# Patient Record
Sex: Female | Born: 1970 | Race: White | Hispanic: No | Marital: Single
Health system: Southern US, Community
[De-identification: ages and names within clinical notes are randomized; demographics above are authoritative.]

---

## 2003-03-13 ENCOUNTER — Emergency Department (HOSPITAL_COMMUNITY): Admission: EM | Admit: 2003-03-13 | Discharge: 2003-03-13 | Payer: Self-pay | Admitting: Emergency Medicine

## 2007-03-03 ENCOUNTER — Emergency Department (HOSPITAL_COMMUNITY): Admission: EM | Admit: 2007-03-03 | Discharge: 2007-03-03 | Payer: Self-pay | Admitting: Emergency Medicine

## 2011-02-25 LAB — URINALYSIS, ROUTINE W REFLEX MICROSCOPIC
Bilirubin Urine: NEGATIVE
Nitrite: NEGATIVE
Protein, ur: NEGATIVE
Specific Gravity, Urine: 1.025
Urobilinogen, UA: 0.2
pH: 5.5

## 2011-02-25 LAB — CBC
HCT: 44.3
RBC: 5.06
RDW: 13.4

## 2011-02-25 LAB — BASIC METABOLIC PANEL
CO2: 21
Calcium: 9.3
Chloride: 106
GFR calc Af Amer: >60
GFR calc non Af Amer: >60
Glucose, Bld: 125 — ABNORMAL HIGH
Sodium: 137

## 2011-02-25 LAB — PREGNANCY, URINE: Preg Test, Ur: NEGATIVE

## 2011-02-25 LAB — URINE MICROSCOPIC-ADD ON

## 2011-02-25 LAB — DIFFERENTIAL
Basophils Absolute: 0.8 — ABNORMAL HIGH
Eosinophils Relative: 2
Lymphs Abs: 3.4 — ABNORMAL HIGH
Monocytes Absolute: 1.4 — ABNORMAL HIGH

## 2016-01-06 ENCOUNTER — Emergency Department (HOSPITAL_COMMUNITY): Payer: Self-pay

## 2016-01-06 ENCOUNTER — Emergency Department (HOSPITAL_COMMUNITY)
Admission: EM | Admit: 2016-01-06 | Discharge: 2016-01-06 | Disposition: A | Discharge status: Home / Self Care | Payer: Self-pay | Attending: Emergency Medicine | Admitting: Emergency Medicine

## 2016-01-06 ENCOUNTER — Encounter (HOSPITAL_COMMUNITY): Payer: Self-pay | Admitting: Radiology

## 2016-01-06 DIAGNOSIS — I10 Essential (primary) hypertension: Secondary | ICD-10-CM | POA: Insufficient documentation

## 2016-01-06 DIAGNOSIS — R079 Chest pain, unspecified: Secondary | ICD-10-CM

## 2016-01-06 DIAGNOSIS — R0789 Other chest pain: Secondary | ICD-10-CM | POA: Insufficient documentation

## 2016-01-06 LAB — BASIC METABOLIC PANEL
Anion gap: 8 (ref 5–15)
BUN: 9 mg/dL (ref 6–20)
CHLORIDE: 105 mmol/L (ref 101–111)
CO2: 23 mmol/L (ref 22–32)
CREATININE: 0.83 mg/dL (ref 0.44–1.00)
Calcium: 9.6 mg/dL (ref 8.9–10.3)
GFR calc non Af Amer: >60 mL/min (ref >60)
GLUCOSE: 119 mg/dL — AB (ref 65–99)
Potassium: 3.8 mmol/L (ref 3.5–5.1)
Sodium: 136 mmol/L (ref 135–145)

## 2016-01-06 LAB — CBC WITH DIFFERENTIAL/PLATELET
Basophils Absolute: 0.1 10*3/uL (ref 0.0–0.1)
Basophils Relative: 0 %
EOS ABS: 0.2 10*3/uL (ref 0.0–0.7)
Eosinophils Relative: 1 %
HCT: 42.7 % (ref 36.0–46.0)
HEMOGLOBIN: 13.8 g/dL (ref 12.0–15.0)
LYMPHS ABS: 2.4 10*3/uL (ref 0.7–4.0)
Lymphocytes Relative: 16 %
MCH: 28.8 pg (ref 26.0–34.0)
MCHC: 32.3 g/dL (ref 30.0–36.0)
MCV: 89.1 fL (ref 78.0–100.0)
MONO ABS: 0.8 10*3/uL (ref 0.1–1.0)
MONOS PCT: 5 %
NEUTROS PCT: 78 %
Neutro Abs: 11.6 10*3/uL — ABNORMAL HIGH (ref 1.7–7.7)
Platelets: 306 10*3/uL (ref 150–400)
RBC: 4.79 MIL/uL (ref 3.87–5.11)
RDW: 14.1 % (ref 11.5–15.5)
WBC: 15 10*3/uL — ABNORMAL HIGH (ref 4.0–10.5)

## 2016-01-06 LAB — TROPONIN I

## 2016-01-06 LAB — I-STAT TROPONIN, ED: TROPONIN I, POC: 0.01 ng/mL (ref 0.00–0.08)

## 2016-01-06 LAB — I-STAT BETA HCG BLOOD, ED (MC, WL, AP ONLY): I-stat hCG, quantitative: <5 m[IU]/mL (ref <5)

## 2016-01-06 MED ORDER — SODIUM CHLORIDE 0.9 % IV BOLUS (SEPSIS)
1000.0000 mL | Freq: Once | INTRAVENOUS | Status: AC
  Administered 2016-01-06: 1000 mL via INTRAVENOUS

## 2016-01-06 MED ORDER — IOPAMIDOL (ISOVUE-300) INJECTION 61%: 75.0000 mL | Freq: Once | INTRAVENOUS | Status: DC | PRN

## 2016-01-06 NOTE — ED Notes (Signed)
Pt. Ambulatory to bathroom with no complaints of dizziness/pain.

## 2016-01-06 NOTE — Discharge Instructions (Signed)

## 2016-01-06 NOTE — ED Provider Notes (Signed)
Emergency Department Provider Note   I have reviewed the triage vital signs and the nursing notes.   HISTORY  Chief Complaint Chest Pain   HPI Abigail Stanton is a 45 y.o. female with PMH of HTN and tobacco/marijuana use presents to the emergency department for evaluation of chest pressure. The patient states that she did not sleep much last night and was planning on going 2 and a clips party today. At the party she was smoking a large amount of "high quality" marijuana which is unusual for her. She reports feeling "pretty high" and then shortly after developed some central chest pressure. No associated difficulty breathing or nausea. No other people smoking the marijuana had similar symptoms. She reports "seeing god and a dove and then I was asking people around me to call 911." She denies using other drugs. Denies alcohol use. She denies any headache, fever, neck discomfort. She reports that her chest discomfort has resolved at this time.  History reviewed. No pertinent past medical history.  There are no active problems to display for this patient.   No past surgical history on file.  Current Outpatient Rx  . Order #: [id number] Class: Historical Med    Allergies Review of patient's allergies indicates no known allergies.  No family history on file.  Social History Social History  Substance Use Topics  . Smoking status: Not on file  . Smokeless tobacco: Not on file  . Alcohol use Not on file    Review of Systems  Constitutional: No fever/chills Eyes: No visual changes. ENT: No sore throat. Cardiovascular: Positive chest pain. Respiratory: Denies shortness of breath. Gastrointestinal: No abdominal pain.  No nausea, no vomiting.  No diarrhea.  No constipation. Genitourinary: Negative for dysuria. Musculoskeletal: Negative for back pain. Skin: Negative for rash. Neurological: Negative for headaches, focal weakness or numbness.  10-point ROS otherwise  negative.  ____________________________________________   PHYSICAL EXAM:  VITAL SIGNS: ED Triage Vitals [01/06/16 1537]  Enc Vitals Group     BP 186/98     Pulse Rate (!) 126     Resp 20     Temp 99.1 F (37.3 C)     Temp Source Oral     SpO2 98 %   Constitutional: Alert and oriented. Well appearing and in no acute distress. Eyes: Conjunctivae are normal. PERRL. EOMI. Head: Atraumatic. Nose: No congestion/rhinnorhea. Mouth/Throat: Mucous membranes are moist.  Oropharynx non-erythematous. Neck: No stridor.  No meningeal signs.   Cardiovascular: Tachycardia. Good peripheral circulation. Grossly normal heart sounds.   Respiratory: Normal respiratory effort.  No retractions. Lungs CTAB. Gastrointestinal: Soft and nontender. No distention.  Musculoskeletal: No lower extremity tenderness nor edema. No gross deformities of extremities. Neurologic:  Normal speech and language. No gross focal neurologic deficits are appreciated.  Skin:  Skin is warm, dry and intact. No rash noted. Psychiatric: Mood and affect are normal. Speech and behavior are normal.  ____________________________________________   LABS (all labs ordered are listed, but only abnormal results are displayed)  Labs Reviewed  BASIC METABOLIC PANEL - Abnormal; Notable for the following:       Result Value   Glucose, Bld 119 (*)    All other components within normal limits  CBC WITH DIFFERENTIAL/PLATELET - Abnormal; Notable for the following:    WBC 15.0 (*)    Neutro Abs 11.6 (*)    All other components within normal limits  TROPONIN I  I-STAT TROPOININ, ED  I-STAT BETA HCG BLOOD, ED ([facility], [facility], [facility] ONLY)  ____________________________________________  EKG  Reviewed in [facility]. No STEMI.  ____________________________________________  RADIOLOGY  Dg Chest 2 View  Result Date: 01/06/2016 CLINICAL DATA:  Substernal chest pain after smoking marijuana and having vapor THC. EXAM: CHEST  2 VIEW COMPARISON:  Abdomen  CT dated 03/03/2007. FINDINGS: Enlarged cardiac silhouette with a globular configuration of the heart, with a prominent rounded right heart border. The heart was not included on the previous abdomen CT images. Diffuse peribronchial thickening. Mild thoracic spine degenerative changes. IMPRESSION: 1. Enlarged cardiac silhouette with a globular configuration of the heart and a prominent, rounded right heart border. This could be due to an epicardial cyst, prominent epicardial fat pad or a mass. A pericardial effusion could potentially produce this appearance. Further evaluation with a chest CT with contrast is recommended. 2. Moderate bronchitic changes. Electronically Signed   By: Beckie SaltsSteven  Reid M.D.   On: 01/06/2016 16:53   Ct Chest W Contrast  Result Date: 01/06/2016 CLINICAL DATA:  Chest pain. Abnormal heart contours on chest radiographs earlier today. EXAM: CT CHEST WITH CONTRAST TECHNIQUE: Multidetector CT imaging of the chest was performed during intravenous contrast administration. CONTRAST:  75 cc Isovue-300 COMPARISON:  Chest radiographs obtained earlier today. FINDINGS: Cardiovascular: Normal sized heart. No arterial calcifications are seen. Mediastinum/Nodes: No enlarged lymph nodes. There is fatty tissue anterior and lateral to the heart on the right, measuring 14.0 x 6.6 cm on image number 66 of series 201. This is contiguous with the anterior abdominal fat anterior to the medial leaves of the diaphragm. The fat tissue between the medial portions of the diaphragm anteriorly and anterior abdominal wall measures 2 cm in thickness on sagittal image number 73. The anterior mediastinal fat portion measures 8.4 cm in length on coronal image number 45. No enlarged lymph nodes. Small hiatal hernia. Lungs/Pleura: Small amount of bilateral dependent linear atelectasis or scarring. There is also a linear scarring in the superior aspect of the right middle lobe, along the minor fissure. No lung nodules or pleural  fluid. Upper Abdomen: Unremarkable. Musculoskeletal: Thoracic spine degenerative changes. IMPRESSION: 1. 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 2. Small hiatal hernia. Electronically Signed   By: Beckie SaltsSteven  Reid M.D.   On: 01/06/2016 18:23    ____________________________________________   PROCEDURES  Procedure(s) performed:   Procedures  None ____________________________________________   INITIAL IMPRESSION / ASSESSMENT AND PLAN / ED COURSE  Pertinent labs & imaging results that were available during my care of the patient were reviewed by me and considered in my medical decision making (see chart for details).  Patient presents to the emergency department for evaluation of chest pressure in the setting of marijuana use at a party. No other coingestions of drugs or alcohol reported. Patient asymptomatic at this time. She does have significant sinus tachycardia on arrival to the emergency department and still seems somewhat intoxicated. Her chest x-ray shows an enlarged cardiac silhouette with globular, rounded heart border. Radiology reports this could be a cyst, prominent fat pad, pericardial effusion. Given the patient's chest discomfort and sinus tachycardia on arrival plan to follow radiology recommendation for CT scan of the chest with contrast.   07:36 PM Updated patient regarding CT scan results. No further workup of abnormal chest x-ray required. Awaiting second troponin. Patient is chest pain-free. Plan for discharge at second lab negative.  08:59 PM Patient is eating and drinking well. Does not appear intoxicated. Repeat troponin is negative. Plan for discharge at this time and  primary care physician follow-up.  At this time, I do not feel there is any life-threatening condition present. I have reviewed and discussed all results (EKG, imaging, lab, urine as appropriate), exam findings with patient. I have reviewed nursing notes and  appropriate previous records.  I feel the patient is safe to be discharged home without further emergent workup. Discussed usual and customary return precautions. Patient and family (if present) verbalize understanding and are comfortable with this plan.  Patient will follow-up with their primary care provider. If they do not have a primary care provider, information for follow-up has been provided to them. All questions have been answered.  ____________________________________________  FINAL CLINICAL IMPRESSION(S) / ED DIAGNOSES  Final diagnoses:  Nonspecific chest pain     MEDICATIONS GIVEN DURING THIS VISIT:  Medications  iopamidol (ISOVUE-300) 61 % injection 75 mL (not administered)  sodium chloride 0.9 % bolus 1,000 mL (0 mLs Intravenous Stopped 01/06/16 1855)     NEW OUTPATIENT MEDICATIONS STARTED DURING THIS VISIT:  New Prescriptions   No medications on file      Note:  This document was prepared using Dragon voice recognition software and may include unintentional dictation errors.  Alona Bene, MD Emergency Medicine   Maia Plan, MD 01/06/16 2101

## 2016-01-06 NOTE — ED Notes (Signed)
Patient transported to CT 

## 2016-01-06 NOTE — ED Triage Notes (Signed)
EMS - Patient was at a solar eclipse party when patient started have substernal chest pain after smoking marijuana and having vapor THC.  Current pain is 0/10.

## 2018-02-19 IMAGING — CT CT CHEST W/ CM
2 of 3 series · 13 of 36 positions shown, 16 images · IV contrast (Iodine)
Comparison: Chest radiographs obtained earlier today.

CLINICAL DATA: Chest pain. Abnormal heart contours on chest
radiographs earlier today.

EXAM:
CT CHEST WITH CONTRAST
TECHNIQUE: Multidetector CT imaging of the chest was performed during
intravenous contrast administration.
CONTRAST:  75 cc Csovue-BRR

[Series 201: chest with, idose (2) · axial · 0.75mm/px · z∈[-262,-7]mm · 10 of 120 slices shown, 13 images]
[im 9/120  mediastinal]
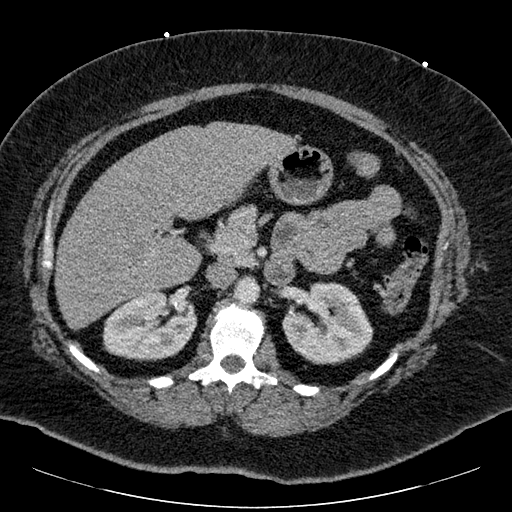
[im 9/120  lung]
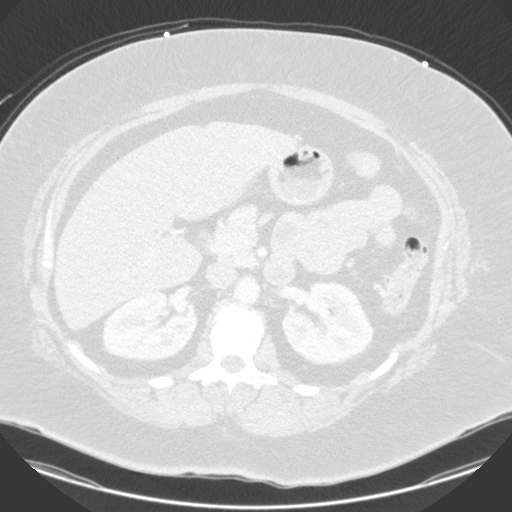
[im 18/120  lung]
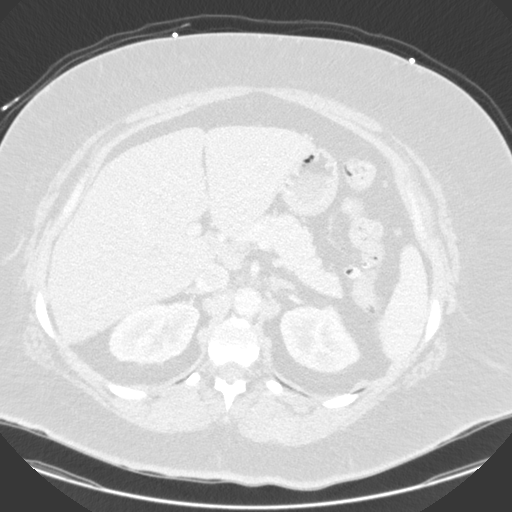
[im 31/120  lung]
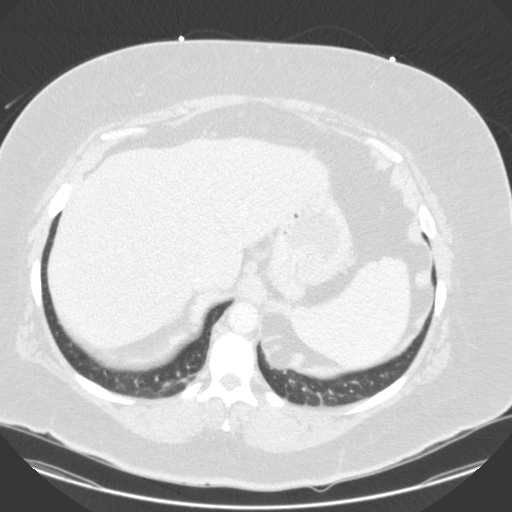
[im 45/120  lung]
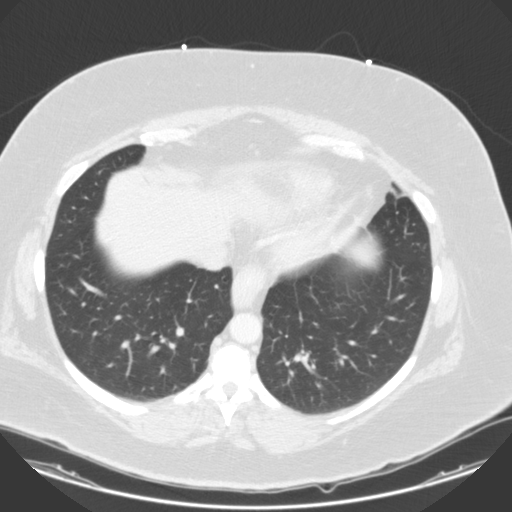
[im 53/120  mediastinal]
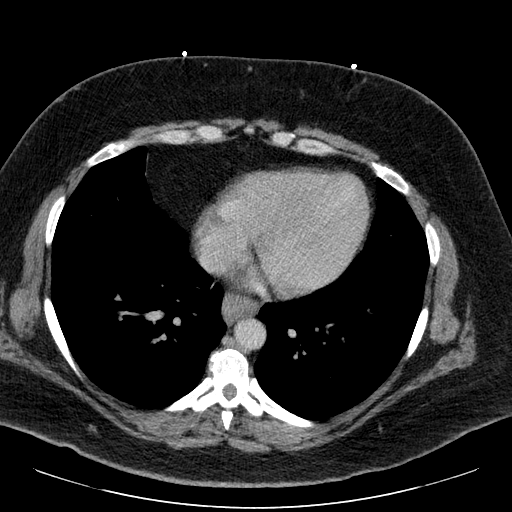
[im 53/120  lung]
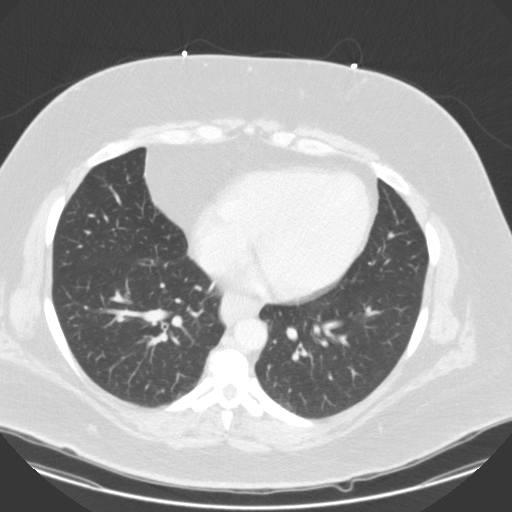
[im 67/120  lung]
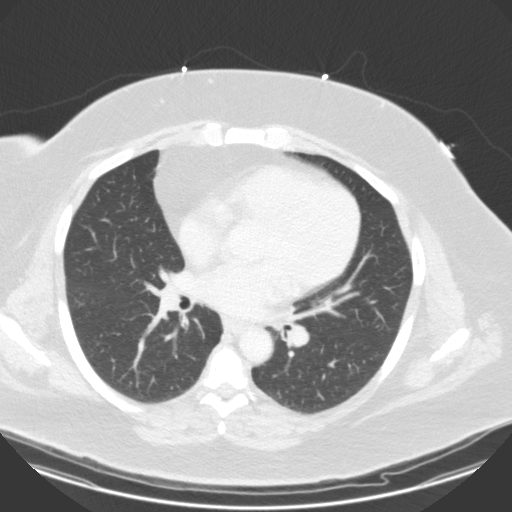
[im 75/120  lung]
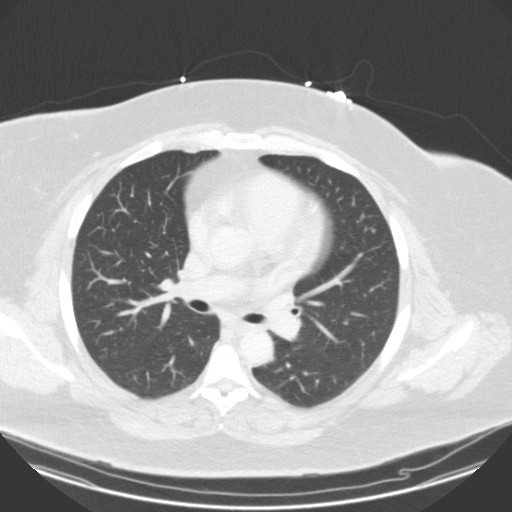
[im 89/120  lung]
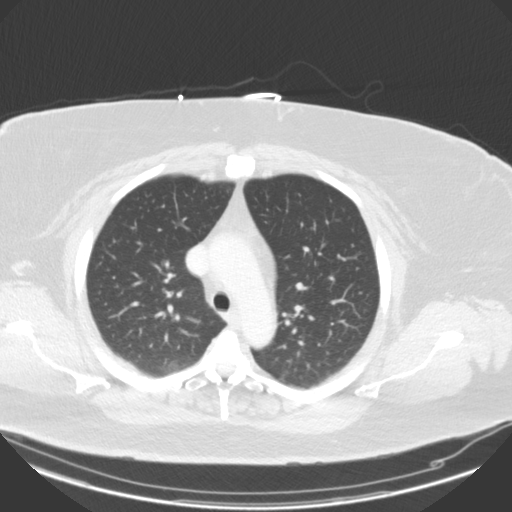
[im 102/120  mediastinal]
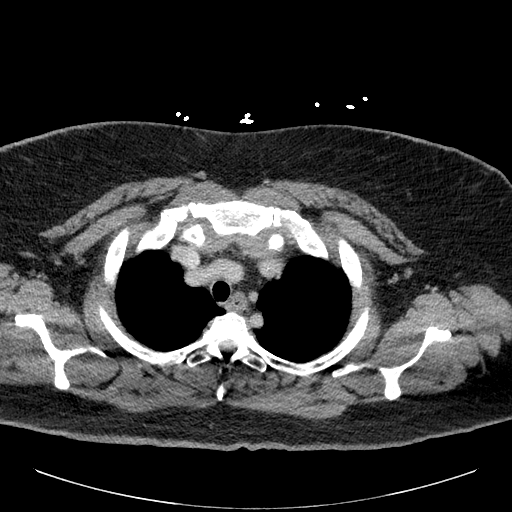
[im 102/120  lung]
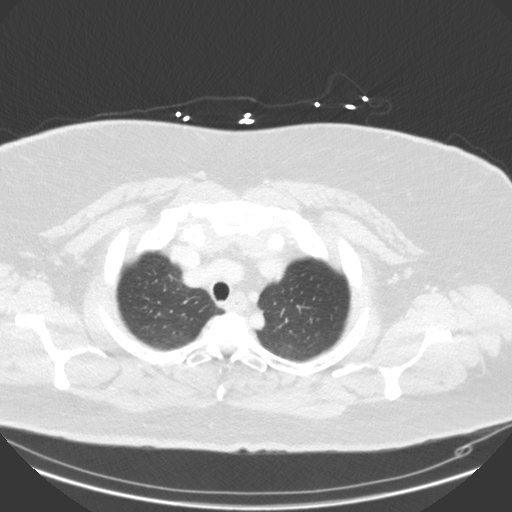
[im 111/120  lung]
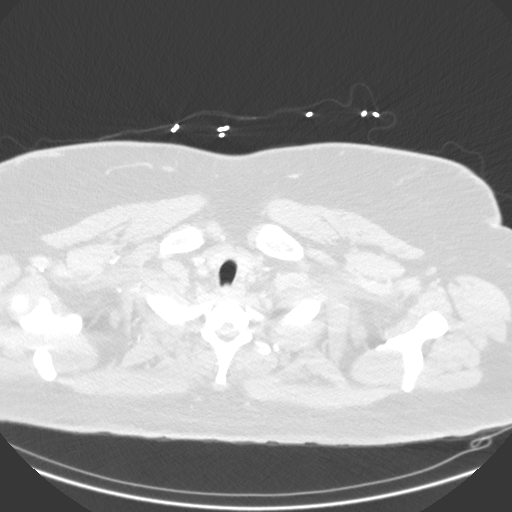

[Series 203: coronal, idose (2) · coronal · 0.45mm/px · 3 of 150 slices shown]
[im 30/150  lung]
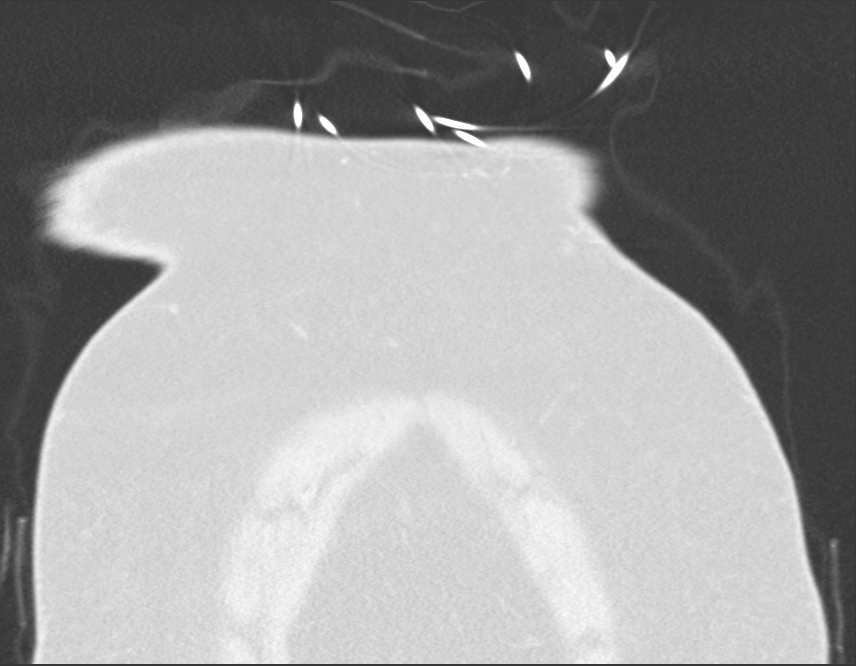
[im 60/150  lung]
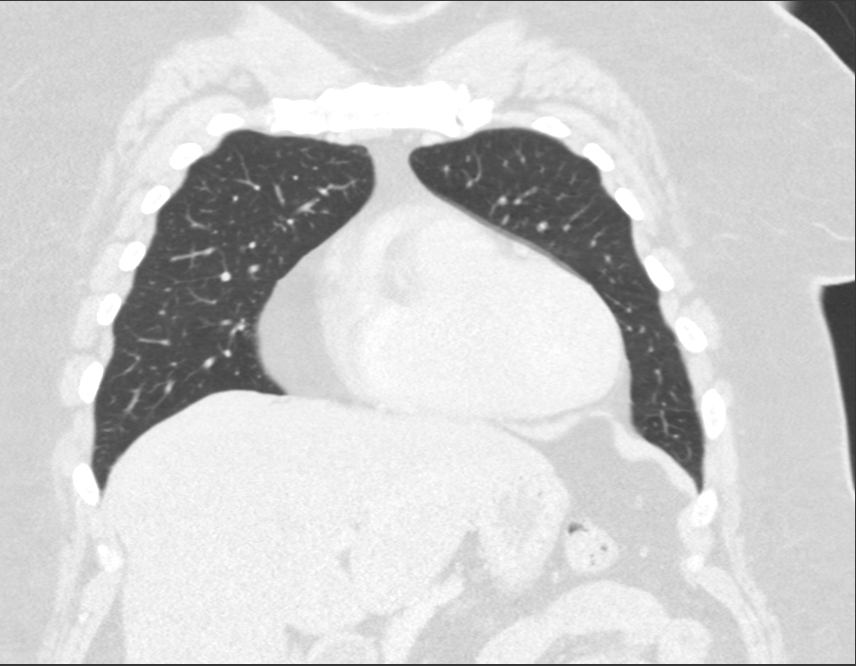
[im 90/150  lung]
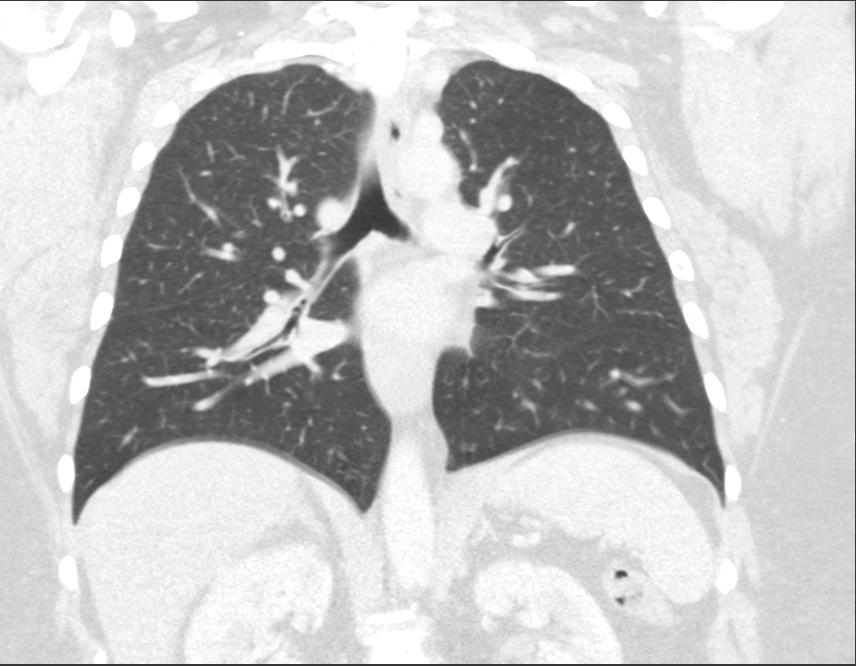

[13 of 36 positions shown; findings below may reference images not displayed]

FINDINGS: Cardiovascular: Normal sized heart. No arterial calcifications are
seen.

Mediastinum/Nodes: No enlarged lymph nodes. There is fatty tissue
anterior and lateral to the heart on the right, measuring 14.0 x
cm on image number 66 of series 201. This is contiguous with the
anterior abdominal fat anterior to the medial leaves of the
diaphragm. The fat tissue between the medial portions of the
diaphragm anteriorly and anterior abdominal wall measures 2 cm in
thickness on sagittal image number 73. The anterior mediastinal fat
portion measures 8.4 cm in length on coronal image number 45. No
enlarged lymph nodes. Small hiatal hernia.

Lungs/Pleura: Small amount of bilateral dependent linear atelectasis
or scarring. There is also a linear scarring in the superior aspect
of the right middle lobe, along the minor fissure. No lung nodules
or pleural fluid.

Upper Abdomen: Unremarkable.

Musculoskeletal: Thoracic spine degenerative changes.
IMPRESSION: 1. Large epicardial fat or lipoma anterior and lateral to the heart
on the right.
2. Small hiatal hernia.
